# Patient Record
Sex: Female | Born: 1975 | Race: Black or African American | Hispanic: No | Marital: Married | Smoking: Never smoker
Health system: Southern US, Community
[De-identification: ages and names within clinical notes are randomized; demographics above are authoritative.]

## PROBLEM LIST (undated history)

## (undated) DIAGNOSIS — F319 Bipolar disorder, unspecified: Secondary | ICD-10-CM

## (undated) DIAGNOSIS — F32A Depression, unspecified: Secondary | ICD-10-CM

## (undated) DIAGNOSIS — F329 Major depressive disorder, single episode, unspecified: Secondary | ICD-10-CM

## (undated) HISTORY — PX: TUBAL LIGATION: SHX77

---

## 1998-04-21 ENCOUNTER — Ambulatory Visit (HOSPITAL_BASED_OUTPATIENT_CLINIC_OR_DEPARTMENT_OTHER): Admission: RE | Admit: 1998-04-21 | Discharge: 1998-04-21 | Payer: Self-pay | Admitting: Surgery

## 1998-07-30 ENCOUNTER — Inpatient Hospital Stay (HOSPITAL_COMMUNITY): Admission: AD | Admit: 1998-07-30 | Discharge: 1998-07-30 | Payer: Self-pay | Admitting: Obstetrics & Gynecology

## 1999-01-14 ENCOUNTER — Inpatient Hospital Stay (HOSPITAL_COMMUNITY): Admission: AD | Admit: 1999-01-14 | Discharge: 1999-01-14 | Payer: Self-pay | Admitting: *Deleted

## 1999-02-26 ENCOUNTER — Encounter: Payer: Self-pay | Admitting: Obstetrics & Gynecology

## 1999-02-26 ENCOUNTER — Ambulatory Visit (HOSPITAL_COMMUNITY): Admission: RE | Admit: 1999-02-26 | Discharge: 1999-02-26 | Payer: Self-pay | Admitting: Obstetrics and Gynecology

## 1999-03-18 ENCOUNTER — Inpatient Hospital Stay (HOSPITAL_COMMUNITY): Admission: AD | Admit: 1999-03-18 | Discharge: 1999-03-18 | Payer: Self-pay | Admitting: Obstetrics and Gynecology

## 1999-03-21 ENCOUNTER — Inpatient Hospital Stay (HOSPITAL_COMMUNITY): Admission: AD | Admit: 1999-03-21 | Discharge: 1999-03-21 | Payer: Self-pay | Admitting: Obstetrics & Gynecology

## 1999-03-23 ENCOUNTER — Inpatient Hospital Stay (HOSPITAL_COMMUNITY): Admission: AD | Admit: 1999-03-23 | Discharge: 1999-03-27 | Payer: Self-pay | Admitting: Obstetrics and Gynecology

## 1999-09-17 ENCOUNTER — Other Ambulatory Visit: Admission: RE | Admit: 1999-09-17 | Discharge: 1999-09-17 | Payer: Self-pay | Admitting: Obstetrics and Gynecology

## 1999-11-30 ENCOUNTER — Other Ambulatory Visit: Admission: RE | Admit: 1999-11-30 | Discharge: 1999-11-30 | Payer: Self-pay | Admitting: Obstetrics and Gynecology

## 2000-08-02 ENCOUNTER — Other Ambulatory Visit: Admission: RE | Admit: 2000-08-02 | Discharge: 2000-08-02 | Payer: Self-pay | Admitting: Obstetrics and Gynecology

## 2001-11-14 ENCOUNTER — Other Ambulatory Visit: Admission: RE | Admit: 2001-11-14 | Discharge: 2001-11-14 | Payer: Self-pay | Admitting: Obstetrics and Gynecology

## 2002-09-06 ENCOUNTER — Encounter: Payer: Self-pay | Admitting: *Deleted

## 2002-09-06 ENCOUNTER — Ambulatory Visit (HOSPITAL_COMMUNITY): Admission: RE | Admit: 2002-09-06 | Discharge: 2002-09-06 | Payer: Self-pay | Admitting: *Deleted

## 2003-01-23 ENCOUNTER — Other Ambulatory Visit: Admission: RE | Admit: 2003-01-23 | Discharge: 2003-01-23 | Payer: Self-pay | Admitting: Obstetrics and Gynecology

## 2003-02-01 ENCOUNTER — Ambulatory Visit (HOSPITAL_COMMUNITY): Admission: RE | Admit: 2003-02-01 | Discharge: 2003-02-01 | Payer: Self-pay | Admitting: Obstetrics and Gynecology

## 2003-02-01 ENCOUNTER — Encounter: Payer: Self-pay | Admitting: Obstetrics and Gynecology

## 2003-02-07 ENCOUNTER — Inpatient Hospital Stay (HOSPITAL_COMMUNITY): Admission: AD | Admit: 2003-02-07 | Discharge: 2003-02-07 | Payer: Self-pay | Admitting: Obstetrics and Gynecology

## 2003-06-13 ENCOUNTER — Inpatient Hospital Stay (HOSPITAL_COMMUNITY): Admission: AD | Admit: 2003-06-13 | Discharge: 2003-06-13 | Payer: Self-pay | Admitting: Obstetrics and Gynecology

## 2003-06-14 ENCOUNTER — Encounter: Payer: Self-pay | Admitting: Obstetrics and Gynecology

## 2003-06-14 ENCOUNTER — Ambulatory Visit (HOSPITAL_COMMUNITY): Admission: RE | Admit: 2003-06-14 | Discharge: 2003-06-14 | Payer: Self-pay | Admitting: Obstetrics and Gynecology

## 2003-06-27 ENCOUNTER — Ambulatory Visit (HOSPITAL_COMMUNITY): Admission: RE | Admit: 2003-06-27 | Discharge: 2003-06-27 | Payer: Self-pay | Admitting: Obstetrics and Gynecology

## 2003-08-23 ENCOUNTER — Inpatient Hospital Stay (HOSPITAL_COMMUNITY): Admission: AD | Admit: 2003-08-23 | Discharge: 2003-08-23 | Payer: Self-pay | Admitting: Obstetrics and Gynecology

## 2003-08-29 ENCOUNTER — Inpatient Hospital Stay (HOSPITAL_COMMUNITY): Admission: RE | Admit: 2003-08-29 | Discharge: 2003-08-29 | Payer: Self-pay | Admitting: Obstetrics and Gynecology

## 2003-08-29 ENCOUNTER — Encounter: Payer: Self-pay | Admitting: Obstetrics and Gynecology

## 2003-08-30 ENCOUNTER — Inpatient Hospital Stay (HOSPITAL_COMMUNITY): Admission: RE | Admit: 2003-08-30 | Discharge: 2003-09-02 | Payer: Self-pay | Admitting: Obstetrics and Gynecology

## 2003-09-03 ENCOUNTER — Encounter: Admission: RE | Admit: 2003-09-03 | Discharge: 2003-10-03 | Payer: Self-pay | Admitting: Obstetrics and Gynecology

## 2004-09-21 ENCOUNTER — Other Ambulatory Visit: Admission: RE | Admit: 2004-09-21 | Discharge: 2004-09-21 | Payer: Self-pay | Admitting: Obstetrics and Gynecology

## 2005-06-25 ENCOUNTER — Encounter: Admission: RE | Admit: 2005-06-25 | Discharge: 2005-06-25 | Payer: Self-pay | Admitting: Obstetrics and Gynecology

## 2005-07-24 ENCOUNTER — Emergency Department (HOSPITAL_COMMUNITY): Admission: EM | Admit: 2005-07-24 | Discharge: 2005-07-24 | Payer: Self-pay | Admitting: Emergency Medicine

## 2006-05-17 ENCOUNTER — Other Ambulatory Visit: Admission: RE | Admit: 2006-05-17 | Discharge: 2006-05-17 | Payer: Self-pay | Admitting: Obstetrics and Gynecology

## 2006-09-28 ENCOUNTER — Ambulatory Visit (HOSPITAL_COMMUNITY): Admission: RE | Admit: 2006-09-28 | Discharge: 2006-09-28 | Payer: Self-pay | Admitting: Family Medicine

## 2007-02-14 ENCOUNTER — Emergency Department (HOSPITAL_COMMUNITY): Admission: EM | Admit: 2007-02-14 | Discharge: 2007-02-14 | Payer: Self-pay | Admitting: Emergency Medicine

## 2007-02-20 ENCOUNTER — Encounter: Admission: RE | Admit: 2007-02-20 | Discharge: 2007-03-22 | Payer: Self-pay | Admitting: Family Medicine

## 2012-06-04 ENCOUNTER — Emergency Department (HOSPITAL_BASED_OUTPATIENT_CLINIC_OR_DEPARTMENT_OTHER): Payer: Managed Care, Other (non HMO)

## 2012-06-04 ENCOUNTER — Emergency Department (HOSPITAL_BASED_OUTPATIENT_CLINIC_OR_DEPARTMENT_OTHER)
Admission: EM | Admit: 2012-06-04 | Discharge: 2012-06-04 | Disposition: A | Discharge status: Home Health | Payer: Managed Care, Other (non HMO) | Attending: Emergency Medicine | Admitting: Emergency Medicine

## 2012-06-04 ENCOUNTER — Encounter (HOSPITAL_BASED_OUTPATIENT_CLINIC_OR_DEPARTMENT_OTHER): Payer: Self-pay | Admitting: *Deleted

## 2012-06-04 ENCOUNTER — Other Ambulatory Visit: Payer: Self-pay

## 2012-06-04 DIAGNOSIS — R091 Pleurisy: Secondary | ICD-10-CM | POA: Insufficient documentation

## 2012-06-04 DIAGNOSIS — F319 Bipolar disorder, unspecified: Secondary | ICD-10-CM | POA: Insufficient documentation

## 2012-06-04 DIAGNOSIS — E876 Hypokalemia: Secondary | ICD-10-CM | POA: Insufficient documentation

## 2012-06-04 HISTORY — DX: Depression, unspecified: F32.A

## 2012-06-04 HISTORY — DX: Bipolar disorder, unspecified: F31.9

## 2012-06-04 HISTORY — DX: Major depressive disorder, single episode, unspecified: F32.9

## 2012-06-04 LAB — URINALYSIS, ROUTINE W REFLEX MICROSCOPIC
Bilirubin Urine: NEGATIVE
Glucose, UA: NEGATIVE mg/dL
Ketones, ur: NEGATIVE mg/dL
Nitrite: NEGATIVE
Protein, ur: NEGATIVE mg/dL
pH: 5.5 (ref 5.0–8.0)

## 2012-06-04 LAB — CBC
Hemoglobin: 12.7 g/dL (ref 12.0–15.0)
Platelets: 234 10*3/uL (ref 150–400)
RBC: 4.22 MIL/uL (ref 3.87–5.11)
WBC: 8.3 10*3/uL (ref 4.0–10.5)

## 2012-06-04 LAB — DIFFERENTIAL
Eosinophils Absolute: 0.1 10*3/uL (ref 0.0–0.7)
Lymphocytes Relative: 32 % (ref 12–46)
Lymphs Abs: 2.6 10*3/uL (ref 0.7–4.0)
Monocytes Relative: 7 % (ref 3–12)
Neutro Abs: 5 10*3/uL (ref 1.7–7.7)
Neutrophils Relative %: 61 % (ref 43–77)

## 2012-06-04 LAB — COMPREHENSIVE METABOLIC PANEL
ALT: 20 U/L (ref 0–35)
Alkaline Phosphatase: 69 U/L (ref 39–117)
BUN: 10 mg/dL (ref 6–23)
Chloride: 101 mEq/L (ref 96–112)
GFR calc Af Amer: >90 mL/min (ref >90)
Glucose, Bld: 108 mg/dL — ABNORMAL HIGH (ref 70–99)
Potassium: 3.2 mEq/L — ABNORMAL LOW (ref 3.5–5.1)
Sodium: 138 mEq/L (ref 135–145)
Total Bilirubin: 0.3 mg/dL (ref 0.3–1.2)
Total Protein: 7.5 g/dL (ref 6.0–8.3)

## 2012-06-04 MED ORDER — IBUPROFEN 600 MG PO TABS: 600.0000 mg | ORAL_TABLET | Freq: Four times a day (QID) | ORAL | Status: AC | PRN

## 2012-06-04 MED ORDER — POTASSIUM CHLORIDE CRYS ER 20 MEQ PO TBCR: 20.0000 meq | EXTENDED_RELEASE_TABLET | Freq: Two times a day (BID) | ORAL | Status: AC

## 2012-06-04 NOTE — Discharge Instructions (Signed)
Foods Rich in Potassium Food / Potassium (mg)  Apricots, dried,  cup / 378 mg   Apricots, raw, 1 cup halves / 401 mg   Avocado,  / 487 mg   Banana, 1 large / 487 mg   Beef, lean, round, 3 oz / 202 mg   Cantaloupe, 1 cup cubes / 427 mg   Dates, medjool, 5 whole / 835 mg   Ham, cured, 3 oz / 212 mg   Lentils, dried,  cup / 458 mg   Lima beans, frozen,  cup / 258 mg   Orange, 1 large / 333 mg   Orange juice, 1 cup / 443 mg   Peaches, dried,  cup / 398 mg   Peas, split, cooked,  cup / 355 mg   Potato, boiled, 1 medium / 515 mg   Prunes, dried, uncooked,  cup / 318 mg   Raisins,  cup / 309 mg   Salmon, pink, raw, 3 oz / 275 mg   Sardines, canned , 3 oz / 338 mg   Tomato, raw, 1 medium / 292 mg   Tomato juice, 6 oz / 417 mg   [place], 3 oz / 349 mg  Document Released: 12/06/2005 Document Revised: 08/18/2011 Document Reviewed: 04/21/2009 [facility] Patient Information 2012 [place], [place].Hypokalemia Hypokalemia means a low potassium level in the blood.Potassium is an electrolyte that helps regulate the amount of fluid in the body. It also stimulates muscle contraction and maintains a stable acid-base balance.Most of the body's potassium is inside of cells, and only a very small amount is in the blood. Because the amount in the blood is so small, minor changes can have big effects. PREPARATION FOR TEST Testing for potassium requires taking a blood sample taken by needle from a vein in the arm. The skin is cleaned thoroughly before the sample is drawn. There is no other special preparation needed. NORMAL VALUES Potassium levels below 3.5 mEq/L are abnormally low. Levels above 5.1 mEq/L are abnormally high. Ranges for normal findings may vary among different laboratories and hospitals. You should always check with your doctor after having lab work or other tests done to discuss the meaning of your test results and whether your values are considered within normal  limits. MEANING OF TEST  Your caregiver will go over the test results with you and discuss the importance and meaning of your results, as well as treatment options and the need for additional tests, if necessary. A potassium level is frequently part of a routine medical exam. It is usually included as part of a whole "panel" of tests for several blood salts (such as Sodium and Chloride). It may be done as part of follow-up when a low potassium level was found in the past or other blood salts are suspected of being out of balance. A low potassium level might be suspected if you have one or more of the following:  Symptoms of weakness.   Abnormal heart rhythms.   High blood pressure and are taking medication to control this, especially water pills (diuretics).   Kidney disease that can affect your potassium level .   Diabetes requiring the use of insulin. The potassium may fall after taking insulin, especially if the diabetes had been out of control for a while.   A condition requiring the use of cortisone-type medication or certain types of antibiotics.   Vomiting and/or diarrhea for more than a day or two.   A stomach or intestinal condition that may not permit  appropriate absorption of potassium.   Fainting episodes.   Mental confusion.  OBTAINING TEST RESULTS It is your responsibility to obtain your test results. Ask the lab or department performing the test when and how you will get your results.  Please contact your caregiver directly if you have not received the results within one week. At that time, ask if there is anything different or new you should be doing in relation to the results. TREATMENT Hypokalemia can be treated with potassium supplements taken by mouth and/or adjustments in your current medications. A diet high in potassium is also helpful. Foods with high potassium content are:  Peas, lentils, lima beans, nuts, and dried fruit.   Whole grain and bran cereals  and breads.   Fresh fruit, vegetables (bananas, cantaloupe, grapefruit, oranges, tomatoes, honeydew melons, potatoes).   Orange and tomato juices.   Meats. If potassium supplement has been prescribed for you today or your medications have been adjusted, see your personal caregiver in time02 for a re-check.  SEEK MEDICAL CARE IF:  There is a feeling of worsening weakness.   You experience repeated chest palpitations.   You are diabetic and having difficulty keeping your blood sugars in the normal range.   You are experiencing vomiting and/or diarrhea.   You are having difficulty with any of your regular medications.  SEEK IMMEDIATE MEDICAL CARE IF:  You experience chest pain, shortness of breath, or episodes of dizziness.   You have been having vomiting or diarrhea for more than 2 days.   You have a fainting episode.  MAKE SURE YOU:   Understand these instructions.   Will watch your condition.   Will get help right away if you are not doing well or get worse.  Document Released: 12/06/2005 Document Revised: 11/25/2011 Document Reviewed: 11/16/2008 [facility] Patient Information 2012 [place], [place].    Pleurisy Pleurisy is an inflammation and swelling of the lining of the lungs. It usually is the result of an underlying infection or other disease. Because of this inflammation, it hurts to breathe. It is aggravated by coughing or deep breathing. The primary goal in treating pleurisy is to diagnose and treat the condition that caused it.  HOME CARE INSTRUCTIONS   Only take over-the-counter or prescription medicines for pain, discomfort, or fever as directed by your caregiver.   If medications which kill germs (antibiotics) were prescribed, take the entire course. Even if you are feeling better, you need to take them.   Use a cool mist vaporizer to help loosen secretions. This is so the secretions can be coughed up more easily.  SEEK MEDICAL CARE IF:   Your pain is not  controlled with medication or is increasing.   You have an increase inpus like (purulent) secretions brought up with coughing.  SEEK IMMEDIATE MEDICAL CARE IF:   You have blue or dark lips, fingernails, or toenails.   You begin coughing up blood.   You have increased difficulty breathing.   You have continuing pain unrelieved by medicine or lasting more than 1 week.   You have pain that radiates into your neck, arms, or jaw.   You develop increased shortness of breath or wheezing.   You develop a fever, rash, vomiting, fainting, or other serious complaints.  Document Released: 12/06/2005 Document Revised: 11/25/2011 Document Reviewed: 07/07/2007 [facility] Patient Information 2012 [place], [place].

## 2012-06-04 NOTE — ED Provider Notes (Signed)
History   This chart was scribed for Nelia Shi, MD by Toya Smothers. The patient was seen in room MH05/MH05. Patient's care was started at 1939.  CSN: [id number]  Arrival date & time 06/04/12  1939   First MD Initiated Contact with Patient 06/04/12 2001      Chief Complaint  Patient presents with  . Chest Pain   Patient is a 36 y.o. female presenting with chest pain. The history is provided by the patient. No language interpreter was used.  Chest Pain Pertinent negatives for primary symptoms include no fever, no shortness of breath, no cough, no abdominal pain, no nausea and no vomiting.  Pertinent negatives for associated symptoms include no weakness.    Kristina Griffin is a 36 y.o. female who presents to the Emergency Department complaining of sudden onset moderate severe constant localized Right side chest pain onset 4 days ago agrevated with inspiration with associate arm/leg swelling, increased burping, and weight gain, denying coughing of blood, cold, sore throat, and h/o collapsed lung. Pt reports that she has recently changed medication dosage from 200 ml of Lamictal changed to 100 ml and that she had tubal ligations 3 months ago, denying change in activity or diet. 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   Past Medical History  Diagnosis Date  . Depression   . Bipolar disorder     Past Surgical History  Procedure Date  . Cesarean section   . Tubal ligation     History reviewed. No pertinent family history.  History  Substance Use Topics  . Smoking status: Never Smoker   . Smokeless tobacco: Not on file  . Alcohol Use: No   Review of Systems  Constitutional: Positive for unexpected weight change. Negative for fever and chills.  HENT: Negative for sore throat and rhinorrhea.   Eyes: Negative for pain.  Respiratory: Negative for cough and  shortness of breath.   Cardiovascular: Positive for chest pain and leg swelling.  Gastrointestinal: Negative for nausea, vomiting, abdominal pain and diarrhea.       Burping.  Genitourinary: Negative for dysuria.  Musculoskeletal: Negative for back pain.  Skin: Negative for rash.  Neurological: Negative for weakness and headaches.    Allergies  Review of patient's allergies indicates no known allergies.  Home Medications   Current Outpatient Rx  Name Route Sig Dispense Refill  . BUPROPION HCL 100 MG PO TABS Oral Take 100 mg by mouth 2 (two) times daily.    Marland Kitchen LAMOTRIGINE 100 MG PO TABS Oral Take 100 mg by mouth daily.    . IBUPROFEN 600 MG PO TABS Oral Take 1 tablet (600 mg total) by mouth every 6 (six) hours as needed for pain. 30 tablet 0  . POTASSIUM CHLORIDE CRYS ER 20 MEQ PO TBCR Oral Take 1 tablet (20 mEq total) by mouth 2 (two) times daily. 30 tablet 0    BP 113/71  Pulse 72  Temp 98.5 F (36.9 C) (Oral)  Resp 16  Ht 5\' 4"  (1.626 m)  Wt 207 lb (93.895 kg)  BMI 35.53 kg/m2  SpO2 100%  LMP 05/21/2012  Physical Exam  Nursing note and vitals reviewed. Constitutional: She is oriented to person, place, and time. She appears well-developed and well-nourished. No distress.  HENT:  Head: Normocephalic and atraumatic.  Eyes: Pupils are equal, round, and reactive to light.  Neck: Normal  range of motion.  Cardiovascular: Normal rate and intact distal pulses.         Date: 06/04/2012  Rate: 91  Rhythm: normal sinus rhythm  QRS Axis: normal  Intervals: normal  ST/T Wave abnormalities: normal  Conduction Disutrbances: none  Narrative Interpretation: unremarkable      Pulmonary/Chest: No respiratory distress.  Abdominal: Normal appearance. She exhibits no distension.  Musculoskeletal: Normal range of motion.  Neurological: She is alert and oriented to person, place, and time. No cranial nerve deficit.  Skin: Skin is warm and dry. No rash noted.  Psychiatric: She has a  normal mood and affect. Her behavior is normal.    ED Course  Procedures (including critical care time) DIAGNOSTIC STUDIES: Oxygen Saturation is 100% on room air, normal by my interpretation.    COORDINATION OF CARE: 20:07- Ordered Test, blood work. Reviwed EKG.  Labs Reviewed  COMPREHENSIVE METABOLIC PANEL - Abnormal; Notable for the following:    Potassium 3.2 (*)     Glucose, Bld 108 (*)     All other components within normal limits  CBC  DIFFERENTIAL  URINALYSIS, ROUTINE W REFLEX MICROSCOPIC  D-DIMER, QUANTITATIVE  TROPONIN I   Dg Chest 2 View  06/04/2012  *RADIOLOGY REPORT*  Clinical Data: Right-sided pleuritic chest pain.  CHEST - 2 VIEW  Comparison:  None.  Findings:  The heart size and mediastinal contours are within normal limits.  Both lungs are clear.  No evidence of pneumothorax or pleural effusion.  The visualized skeletal structures are unremarkable.  IMPRESSION: No active cardiopulmonary disease.  Original Report Authenticated By: Danae Orleans, M.D.     1. Pleurisy   2. Hypokalemia       MDM   I personally performed the services described in this documentation, which was scribed in my presence. The recorded information has been reviewed and considered.     Nelia Shi, MD 06/04/12 2103

## 2012-06-04 NOTE — ED Notes (Signed)
Pt c/o R sided CP onset 4 days ago.  Pt states pain increases with inspiration.  Pt denies N/V/SOB/diaphoresis.  Pt states she feels as though her extremities are swollen.  Pt states she has been using antigas with no relief.

## 2012-06-04 NOTE — ED Provider Notes (Deleted)
History     CSN: [id number]  Arrival date & time 06/04/12  1939   First MD Initiated Contact with Patient 06/04/12 2001      Chief Complaint  Patient presents with  . Chest Pain     HPI Pt c/o R sided CP onset 4 days ago. Pt states pain increases with inspiration. Pt denies N/V/SOB/diaphoresis. Pt states she feels as though her extremities are swollen. Pt states she has been using antigas with no relief.   Past Medical History  Diagnosis Date  . Depression   . Bipolar disorder     Past Surgical History  Procedure Date  . Cesarean section   . Tubal ligation     History reviewed. No pertinent family history.  History  Substance Use Topics  . Smoking status: Never Smoker   . Smokeless tobacco: Not on file  . Alcohol Use: No    OB History    Grav Para Term Preterm Abortions TAB SAB Ect Mult Living                  Review of Systems  All other systems reviewed and are negative.    Allergies  Review of patient's allergies indicates no known allergies.  Home Medications   Current Outpatient Rx  Name Route Sig Dispense Refill  . BUPROPION HCL 100 MG PO TABS Oral Take 100 mg by mouth 2 (two) times daily.    Marland Kitchen LAMOTRIGINE 100 MG PO TABS Oral Take 100 mg by mouth daily.    . IBUPROFEN 600 MG PO TABS Oral Take 1 tablet (600 mg total) by mouth every 6 (six) hours as needed for pain. 30 tablet 0  . POTASSIUM CHLORIDE CRYS ER 20 MEQ PO TBCR Oral Take 1 tablet (20 mEq total) by mouth 2 (two) times daily. 30 tablet 0    BP 112/71  Pulse 85  Temp 98.5 F (36.9 C) (Oral)  Resp 16  Ht 5\' 4"  (1.626 m)  Wt 207 lb (93.895 kg)  BMI 35.53 kg/m2  SpO2 100%  LMP 05/21/2012  Physical Exam  Nursing note and vitals reviewed. Constitutional: She is oriented to person, place, and time. She appears well-developed and well-nourished. No distress.  HENT:  Head: Normocephalic and atraumatic.  Eyes: Pupils are equal, round, and reactive to light.  Neck: Normal range of  motion.  Cardiovascular: Normal rate and intact distal pulses.        EKG showed no acute changes.  Patient was in sinus rhythm  Pulmonary/Chest: No respiratory distress.  Abdominal: Normal appearance. She exhibits no distension.  Musculoskeletal: Normal range of motion.  Neurological: She is alert and oriented to person, place, and time. No cranial nerve deficit.  Skin: Skin is warm and dry. No rash noted.  Psychiatric: She has a normal mood and affect. Her behavior is normal.    ED Course  Procedures (including critical care time)  Labs Reviewed  COMPREHENSIVE METABOLIC PANEL - Abnormal; Notable for the following:    Potassium 3.2 (*)     Glucose, Bld 108 (*)     All other components within normal limits  CBC  DIFFERENTIAL  URINALYSIS, ROUTINE W REFLEX MICROSCOPIC  TROPONIN I  D-DIMER, QUANTITATIVE   Dg Chest 2 View  06/04/2012  *RADIOLOGY REPORT*  Clinical Data: Right-sided pleuritic chest pain.  CHEST - 2 VIEW  Comparison:  None.  Findings:  The heart size and mediastinal contours are within normal limits.  Both lungs are clear.  No evidence  of pneumothorax or pleural effusion.  The visualized skeletal structures are unremarkable.  IMPRESSION: No active cardiopulmonary disease.  Original Report Authenticated By: Danae Orleans, M.D.     1. Pleurisy   2. Hypokalemia       MDM         Nelia Shi, MD 06/06/12 [phone]

## 2016-06-10 ENCOUNTER — Ambulatory Visit (INDEPENDENT_AMBULATORY_CARE_PROVIDER_SITE_OTHER): Payer: BLUE CROSS/BLUE SHIELD | Admitting: Obstetrics & Gynecology

## 2016-06-10 ENCOUNTER — Encounter: Payer: Self-pay | Admitting: Obstetrics & Gynecology

## 2016-06-10 VITALS — BP 124/78 | HR 67 | Ht 64.0 in | Wt 222.0 lb

## 2016-06-10 DIAGNOSIS — Z113 Encounter for screening for infections with a predominantly sexual mode of transmission: Secondary | ICD-10-CM

## 2016-06-10 DIAGNOSIS — R5383 Other fatigue: Secondary | ICD-10-CM

## 2016-06-10 DIAGNOSIS — R635 Abnormal weight gain: Secondary | ICD-10-CM

## 2016-06-10 DIAGNOSIS — N898 Other specified noninflammatory disorders of vagina: Secondary | ICD-10-CM | POA: Diagnosis not present

## 2016-06-10 LAB — CBC
HCT: 34.8 % — ABNORMAL LOW (ref 35.0–45.0)
Hemoglobin: 11.3 g/dL — ABNORMAL LOW (ref 11.7–15.5)
MCH: 29.3 pg (ref 27.0–33.0)
MCHC: 32.5 g/dL (ref 32.0–36.0)
MCV: 90.2 fL (ref 80.0–100.0)
MPV: 11.1 fL (ref 7.5–12.5)
PLATELETS: 254 10*3/uL (ref 140–400)
RBC: 3.86 MIL/uL (ref 3.80–5.10)
RDW: 15.1 % — AB (ref 11.0–15.0)
WBC: 6.1 10*3/uL (ref 3.8–10.8)

## 2016-06-10 LAB — HEMOGLOBIN A1C
HEMOGLOBIN A1C: 5.8 % — AB (ref <5.7)
MEAN PLASMA GLUCOSE: 120 mg/dL

## 2016-06-10 LAB — TSH: TSH: 0.37 mIU/L — ABNORMAL LOW

## 2016-06-10 NOTE — Progress Notes (Signed)
Patient has had vaginal discharge for a "few weeks" and she has tried over the counter medications with no relief. Patient complaining of odor and thick discharge. Kathrene Alu RN BSN

## 2016-06-10 NOTE — Patient Instructions (Signed)
GO WHITE: Soap: UNSCENTED Dove (white box light green writing) Laundry detergent (underwear)- Dreft or [employer] unscented WHITE 100% cotton panties (NOT just cotton crouch) Sanitary napkin/panty liners: UNSCENTED.  If it doesn't SAY unscented it can have a scent/perfume    NO PERFUMES OR LOTIONS OR POTIONS in the vulvar area (may use regular [place]) Condoms: hypoallergenic only. Non dyed (no color) Toilet papers: white only Wash clothes: use a separate wash cloth. WHITE.  Washed in Dreft.    Bacterial Vaginosis Bacterial vaginosis is a vaginal infection that occurs when the normal balance of bacteria in the vagina is disrupted. It results from an overgrowth of certain bacteria. This is the most common vaginal infection in women of childbearing age. Treatment is important to prevent complications, especially in pregnant women, as it can cause a premature delivery. CAUSES  Bacterial vaginosis is caused by an increase in harmful bacteria that are normally present in smaller amounts in the vagina. Several different kinds of bacteria can cause bacterial vaginosis. However, the reason that the condition develops is not fully understood. RISK FACTORS Certain activities or behaviors can put you at an increased risk of developing bacterial vaginosis, including:  Having a new sex partner or multiple sex partners.  Douching.  Using an intrauterine device (IUD) for contraception. Women do not get bacterial vaginosis from toilet seats, bedding, swimming pools, or contact with objects around them. SIGNS AND SYMPTOMS  Some women with bacterial vaginosis have no signs or symptoms. Common symptoms include:  Grey vaginal discharge.  A fishlike odor with discharge, especially after sexual intercourse.  Itching or burning of the vagina and vulva.  Burning or pain with urination. DIAGNOSIS  Your health care provider will take a medical history and examine the vagina for signs of bacterial vaginosis. A  sample of vaginal fluid may be taken. Your health care provider will look at this sample under a microscope to check for bacteria and abnormal cells. A vaginal pH test may also be done.  TREATMENT  Bacterial vaginosis may be treated with antibiotic medicines. These may be given in the form of a pill or a vaginal cream. A second round of antibiotics may be prescribed if the condition comes back after treatment. Because bacterial vaginosis increases your risk for sexually transmitted diseases, getting treated can help reduce your risk for chlamydia, gonorrhea, HIV, and herpes. HOME CARE INSTRUCTIONS   Only take over-the-counter or prescription medicines as directed by your health care provider.  If antibiotic medicine was prescribed, take it as directed. Make sure you finish it even if you start to feel better.  Tell all sexual partners that you have a vaginal infection. They should see their health care provider and be treated if they have problems, such as a mild rash or itching.  During treatment, it is important that you follow these instructions:  Avoid sexual activity or use condoms correctly.  Do not douche.  Avoid alcohol as directed by your health care provider.  Avoid breastfeeding as directed by your health care provider. SEEK MEDICAL CARE IF:   Your symptoms are not improving after 3 days of treatment.  You have increased discharge or pain.  You have a fever. MAKE SURE YOU:   Understand these instructions.  Will watch your condition.  Will get help right away if you are not doing well or get worse. FOR MORE INFORMATION  Centers for Disease Control and Prevention, Division of STD Prevention: AppraiserFraud.fi American Sexual Health Association (ASHA): www.ashastd.org  This information is not intended to replace advice given to you by your health care provider. Make sure you discuss any questions you have with your health care provider.   Document Released: 12/06/2005  Document Revised: 12/27/2014 Document Reviewed: 07/18/2013 Elsevier Interactive Patient Education [employer].

## 2016-06-10 NOTE — Progress Notes (Signed)
Patient ID: Kristina Griffin, female   DOB: 12-10-1976, 40 y.o.   MRN: [id number] History:  40 y.o. No obstetric history on file. here today for eval of excessive discharge. Pt has tried OTC garlic tabs and douching with no benefits.  The following portions of the patient's history were reviewed and updated as appropriate: allergies, current medications, past family history, past medical history, past social history, past surgical history and problem list.  Review of Systems:  Pt reports increased weight gain despite eaing right and exercising.  Pt reports fatigue despite reg exercise.  Objective:  Physical Exam Blood pressure 124/78, pulse 67, height 5\' 4"  (1.626 m), weight 222 lb (100.699 kg), last menstrual period 05/24/2016. Gen: NAD Abd: Soft, nontender and nondistended Pelvic: Normal appearing external genitalia; normal appearing vaginal mucosa and cervix.  Normal discharge.  Small uterus, no other palpable masses, no uterine or adnexal tenderness  Labs and Imaging No results found.  Assessment & Plan:  Diagnoses and all orders for this visit:  Vaginal discharge -     GC/Chlamydia probe amp ([place])not at [place]  Other fatigue -     CBC -     TSH -     HgB 123456 -     Follicle stimulating hormone  Excessive weight gain -     CBC -     TSH -     HgB 123456 -     Follicle stimulating hormone  Pt given GO WHITE hand out given  Pt will f/u for exam with PAP.  Pt wanted to get labs predrawn today Records from prev GYN Reviewed cardio vs weight lifting to add muscle mass and burn more calories Limiting sugar in all forms reviewed simple vs complex carbs and which to limit (processed foods)  Hoyle Sauer L. Harraway-Smith, M.D., [place]   26min in face to face conversation with pt  Hoyle Sauer L. Harraway-Smith, M.D., [place]

## 2016-06-11 ENCOUNTER — Telehealth: Payer: Self-pay

## 2016-06-11 LAB — WET PREP BY MOLECULAR PROBE
Candida species: POSITIVE — AB
GARDNERELLA VAGINALIS: POSITIVE — AB
TRICHOMONAS VAG: NEGATIVE

## 2016-06-11 LAB — FOLLICLE STIMULATING HORMONE: FSH: 3.7 m[IU]/mL

## 2016-06-11 MED ORDER — METRONIDAZOLE 500 MG PO TABS: 500.0000 mg | ORAL_TABLET | Freq: Two times a day (BID) | ORAL | Status: DC

## 2016-06-11 MED ORDER — FLUCONAZOLE 150 MG PO TABS: 150.0000 mg | ORAL_TABLET | Freq: Once | ORAL | Status: DC

## 2016-06-11 NOTE — Telephone Encounter (Signed)
Left message for patient to return call to office.  Patient has bacterial vaginosis and yeast infection. Rx for both sent to pharmacy. Kathrene Alu RN BSN

## 2016-06-15 LAB — GC/CHLAMYDIA PROBE AMP (~~LOC~~) NOT AT ARMC
CHLAMYDIA, DNA PROBE: NEGATIVE
Neisseria Gonorrhea: NEGATIVE

## 2016-06-19 ENCOUNTER — Encounter: Payer: Self-pay | Admitting: Obstetrics & Gynecology

## 2016-06-21 ENCOUNTER — Ambulatory Visit: Payer: BLUE CROSS/BLUE SHIELD | Admitting: Obstetrics & Gynecology

## 2016-06-24 ENCOUNTER — Encounter: Payer: Self-pay | Admitting: Obstetrics & Gynecology

## 2016-06-24 ENCOUNTER — Ambulatory Visit (INDEPENDENT_AMBULATORY_CARE_PROVIDER_SITE_OTHER): Payer: BLUE CROSS/BLUE SHIELD | Admitting: Obstetrics & Gynecology

## 2016-06-24 VITALS — BP 108/70 | HR 78 | Resp 16 | Ht 64.0 in | Wt 216.0 lb

## 2016-06-24 DIAGNOSIS — Z124 Encounter for screening for malignant neoplasm of cervix: Secondary | ICD-10-CM

## 2016-06-24 DIAGNOSIS — Z01419 Encounter for gynecological examination (general) (routine) without abnormal findings: Secondary | ICD-10-CM

## 2016-06-24 DIAGNOSIS — R946 Abnormal results of thyroid function studies: Secondary | ICD-10-CM | POA: Diagnosis not present

## 2016-06-24 DIAGNOSIS — Z1151 Encounter for screening for human papillomavirus (HPV): Secondary | ICD-10-CM | POA: Diagnosis not present

## 2016-06-24 DIAGNOSIS — Z6837 Body mass index (BMI) 37.0-37.9, adult: Secondary | ICD-10-CM | POA: Diagnosis not present

## 2016-06-24 DIAGNOSIS — N920 Excessive and frequent menstruation with regular cycle: Secondary | ICD-10-CM

## 2016-06-24 DIAGNOSIS — R7303 Prediabetes: Secondary | ICD-10-CM

## 2016-06-24 DIAGNOSIS — D508 Other iron deficiency anemias: Secondary | ICD-10-CM

## 2016-06-24 DIAGNOSIS — R7989 Other specified abnormal findings of blood chemistry: Secondary | ICD-10-CM

## 2016-06-24 MED ORDER — INTEGRA 62.5-62.5-40-3 MG PO CAPS: 1.0000 | ORAL_CAPSULE | Freq: Every day | ORAL | Status: AC

## 2016-06-24 NOTE — Patient Instructions (Signed)
Diabetes and Exercise Exercising regularly is important. It is not just about losing weight. It has many health benefits, such as:  Improving your overall fitness, flexibility, and endurance.  Increasing your bone density.  Helping with weight control.  Decreasing your body fat.  Increasing your muscle strength.  Reducing stress and tension.  Improving your overall health. People with diabetes who exercise gain additional benefits because exercise:  Reduces appetite.  Improves the body's use of blood sugar (glucose).  Helps lower or control blood glucose.  Decreases blood pressure.  Helps control blood lipids (such as cholesterol and triglycerides).  Improves the body's use of the hormone insulin by:  Increasing the body's insulin sensitivity.  Reducing the body's insulin needs.  Decreases the risk for heart disease because exercising:  Lowers cholesterol and triglycerides levels.  Increases the levels of good cholesterol (such as high-density lipoproteins [HDL]) in the body.  Lowers blood glucose levels. YOUR ACTIVITY PLAN  Choose an activity that you enjoy, and set realistic goals. To exercise safely, you should begin practicing any new physical activity slowly, and gradually increase the intensity of the exercise over time. Your health care provider or diabetes educator can help create an activity plan that works for you. General recommendations include:  Encouraging children to engage in at least 60 minutes of physical activity each day.  Stretching and performing strength training exercises, such as yoga or weight lifting, at least 2 times per week.  Performing a total of at least 150 minutes of moderate-intensity exercise each week, such as brisk walking or water aerobics.  Exercising at least 3 days per week, making sure you allow no more than 2 consecutive days to pass without exercising.  Avoiding long periods of inactivity (90 minutes or more). When you  have to spend an extended period of time sitting down, take frequent breaks to walk or stretch. RECOMMENDATIONS FOR EXERCISING WITH TYPE 1 OR TYPE 2 DIABETES   Check your blood glucose before exercising. If blood glucose levels are greater than 240 mg/dL, check for urine ketones. Do not exercise if ketones are present.  Avoid injecting insulin into areas of the body that are going to be exercised. For example, avoid injecting insulin into:  The arms when playing tennis.  The legs when jogging.  Keep a record of:  Food intake before and after you exercise.  Expected peak times of insulin action.  Blood glucose levels before and after you exercise.  The type and amount of exercise you have done.  Review your records with your health care provider. Your health care provider will help you to develop guidelines for adjusting food intake and insulin amounts before and after exercising.  If you take insulin or oral hypoglycemic agents, watch for signs and symptoms of hypoglycemia. They include:  Dizziness.  Shaking.  Sweating.  Chills.  Confusion.  Drink plenty of water while you exercise to prevent dehydration or heat stroke. Body water is lost during exercise and must be replaced.  Talk to your health care provider before starting an exercise program to make sure it is safe for you. Remember, almost any type of activity is better than none.   This information is not intended to replace advice given to you by your health care provider. Make sure you discuss any questions you have with your health care provider.   Document Released: 02/26/2004 Document Revised: 04/22/2015 Document Reviewed: 05/15/2013 Elsevier Interactive Patient Education 2016 Elsevier Inc.  

## 2016-06-24 NOTE — Progress Notes (Signed)
Patient ID: Kristina Griffin, female   DOB: 05/14/1976, 40 y.o.   MRN: [id number] Subjective:     Kristina Griffin is a 40 y.o. female here for a routine exam.  [id number]. LMP 06/19/2016.  Current complaints: Pt presents for an annual GYN exam with PAP. She reports heavy menses.  Sh ereprots that this is a change for her in the past year or so. No bleeding between cycles.  She finds this bothersome. Reports an increased weight gain with difficulty losing weight. Had labs drawn on last vist.    Prev dx'd with hypothyroidism.  Was on med.     Gynecologic History Patient's last menstrual period was 05/24/2016. Contraception: tubal ligation Last Pap: unk. Results were: pt reports an abnormal PAP in her 30's. Repeat was normal. Last mammogram: > 1 year prev. Results were: normal  Obstetric History OB History  No data available   The following portions of the patient's history were reviewed and updated as appropriate: allergies, current medications, past family history, past medical history, past social history, past surgical history and problem list.  Review of Systems Pertinent items are noted in HPI.    Objective:  BP 108/70 mmHg  Pulse 78  Resp 16  Ht 5\' 4"  (1.626 m)  Wt 216 lb (97.977 kg)  BMI 37.06 kg/m2  LMP 06/19/2016 General Appearance:    Alert, cooperative, no distress, appears stated age  Head:    Normocephalic, without obvious abnormality, atraumatic  Eyes:    conjunctiva/corneas clear, EOM's intact, both eyes  Ears:    Normal external ear canals, both ears  Nose:   Nares normal, septum midline, mucosa normal, no drainage    or sinus tenderness  Throat:   Lips, mucosa, and tongue normal; teeth and gums normal  Neck:   Supple, symmetrical, trachea midline, no adenopathy;    thyroid:  no enlargement/tenderness/nodules  Back:     Symmetric, no curvature, ROM normal, no CVA tenderness  Lungs:     Clear to auscultation bilaterally, respirations unlabored  Chest Wall:    No tenderness  or deformity   Heart:    Regular rate and rhythm, S1 and S2 normal, no murmur, rub   or gallop  Breast Exam:    No tenderness, masses, or nipple abnormality  Abdomen:     Soft, non-tender, bowel sounds active all four quadrants,    no masses, no organomegaly  Genitalia:    Normal female without lesion, discharge or tenderness   Vagina: no blood in vault; no abnormal discharge Cervix: no lesion; no mucopurulent d/c Uterus:enlarged; mobile- difficult to fully assess due to body habitus Adnexa: no masses; non tender   Extremities:   Extremities normal, atraumatic, no cyanosis or edema  Pulses:   2+ and symmetric all extremities  Skin:   Skin color, texture, turgor normal, no rashes or lesions   Recent Results (from the past 2160 hour(s))  GC/Chlamydia probe amp ([facility])not at [facility]     Status: None   Collection Time: 06/10/16 12:00 AM  Result Value Ref Range   Chlamydia Negative     Comment: Normal Reference Range - Negative   Neisseria gonorrhea Negative     Comment: Normal Reference Range - Negative  WET PREP BY MOLECULAR PROBE     Status: Abnormal   Collection Time: 06/10/16  9:54 AM  Result Value Ref Range   Candida species POS (A) Negative   Trichomonas vaginosis NEG Negative   Gardnerella vaginalis POS (A) Negative  CBC     Status: Abnormal   Collection Time: 06/10/16 10:32 AM  Result Value Ref Range   WBC 6.1 3.8 - 10.8 K/uL   RBC 3.86 3.80 - 5.10 MIL/uL   Hemoglobin 11.3 (L) 11.7 - 15.5 g/dL   HCT 34.8 (L) 35.0 - 45.0 %   MCV 90.2 80.0 - 100.0 fL   MCH 29.3 27.0 - 33.0 pg   MCHC 32.5 32.0 - 36.0 g/dL   RDW 15.1 (H) 11.0 - 15.0 %   Platelets 254 140 - 400 K/uL   MPV 11.1 7.5 - 12.5 fL    Comment: ** Please note change in unit of measure and reference range(s). **  TSH     Status: Abnormal   Collection Time: 06/10/16 10:32 AM  Result Value Ref Range   TSH 0.37 (L) mIU/L    Comment:   Reference Range   > or = 20 Years  0.40-4.50   Pregnancy Range First  trimester  0.26-2.66 Second trimester 0.55-2.73 Third trimester  0.43-2.91     HgB A1c     Status: Abnormal   Collection Time: 06/10/16 10:32 AM  Result Value Ref Range   Hgb A1c MFr Bld 5.8 (H) <5.7 %    Comment:   For someone without known diabetes, a hemoglobin A1c value between 5.7% and 6.4% is consistent with prediabetes and should be confirmed with a follow-up test.   For someone with known diabetes, a value <7% indicates that their diabetes is well controlled. A1c targets should be individualized based on duration of diabetes, age, co-morbid conditions and other considerations.   This assay result is consistent with an increased risk of diabetes.   Currently, no consensus exists regarding use of hemoglobin A1c for diagnosis of diabetes in children.      Mean Plasma Glucose 123456 mg/dL  Follicle stimulating hormone     Status: None   Collection Time: 06/10/16 10:32 AM  Result Value Ref Range   FSH 3.7 mIU/mL    Comment:   Reference Range Female >=10 years of age: Follicular Phase [id number] Mid-Cycle Peak   3.1-17.7 Luteal Phase     1.5-9.1 Postmenopausal   23.0-116.3      Assessment:    Healthy female exam.   Weight gain Heavy bleeding- suspect uterine fiborids Anemia- pt tried OTC iron but, could not tolerate it Obesity- see counseling rec below Thyroid- prev had abnormal results and required supplementation.  Need to obtain T3 and T4.TSH low Prediabetes- reviewed long term risk.  Reviewed diet and exercise     Plan:   Thyroid screening tests- free T3 and free T4 F/u PAP with hrHPV Screening mammogram F/u in 3 months or sooner prn rec Weight Watchers  Rec exercise program with GYN and weight training to increase muscle mass. Pelvic US- complete and TV   Brandi Tomlinson L. Harraway-Smith, M.D., [place]. Harraway-Smith, M.D., Cherlynn June

## 2016-06-25 LAB — CYTOLOGY - PAP

## 2016-06-25 LAB — T3, FREE: T3 FREE: 2.8 pg/mL (ref 2.3–4.2)

## 2016-06-25 LAB — T4, FREE: FREE T4: 1 ng/dL (ref 0.8–1.8)

## 2016-06-29 ENCOUNTER — Ambulatory Visit (HOSPITAL_BASED_OUTPATIENT_CLINIC_OR_DEPARTMENT_OTHER)
Admission: RE | Admit: 2016-06-29 | Discharge: 2016-06-29 | Disposition: A | Discharge status: Home / Self Care | Payer: BLUE CROSS/BLUE SHIELD | Source: Ambulatory Visit | Attending: Obstetrics & Gynecology | Admitting: Obstetrics & Gynecology

## 2016-06-29 DIAGNOSIS — Z01419 Encounter for gynecological examination (general) (routine) without abnormal findings: Secondary | ICD-10-CM | POA: Diagnosis not present

## 2016-06-29 DIAGNOSIS — N920 Excessive and frequent menstruation with regular cycle: Secondary | ICD-10-CM

## 2016-06-29 DIAGNOSIS — Z124 Encounter for screening for malignant neoplasm of cervix: Secondary | ICD-10-CM | POA: Diagnosis not present

## 2016-06-29 DIAGNOSIS — Z1231 Encounter for screening mammogram for malignant neoplasm of breast: Secondary | ICD-10-CM | POA: Insufficient documentation

## 2016-06-29 DIAGNOSIS — D250 Submucous leiomyoma of uterus: Secondary | ICD-10-CM | POA: Insufficient documentation

## 2016-06-30 ENCOUNTER — Encounter: Payer: Self-pay | Admitting: Obstetrics & Gynecology

## 2016-07-06 ENCOUNTER — Encounter: Payer: Self-pay | Admitting: Obstetrics & Gynecology

## 2016-07-09 ENCOUNTER — Other Ambulatory Visit: Payer: Self-pay | Admitting: Obstetrics & Gynecology

## 2016-07-09 DIAGNOSIS — B3731 Acute candidiasis of vulva and vagina: Secondary | ICD-10-CM

## 2016-07-09 DIAGNOSIS — B373 Candidiasis of vulva and vagina: Secondary | ICD-10-CM

## 2016-07-09 MED ORDER — FLUCONAZOLE 150 MG PO TABS: 150.0000 mg | ORAL_TABLET | Freq: Once | ORAL | Status: DC

## 2016-07-14 ENCOUNTER — Other Ambulatory Visit: Payer: Self-pay

## 2016-07-14 DIAGNOSIS — B3731 Acute candidiasis of vulva and vagina: Secondary | ICD-10-CM

## 2016-07-14 DIAGNOSIS — B373 Candidiasis of vulva and vagina: Secondary | ICD-10-CM

## 2016-07-14 MED ORDER — FLUCONAZOLE 150 MG PO TABS: 150.0000 mg | ORAL_TABLET | Freq: Once | ORAL | 0 refills | Status: AC

## 2017-06-30 ENCOUNTER — Encounter (INDEPENDENT_AMBULATORY_CARE_PROVIDER_SITE_OTHER): Payer: Self-pay | Admitting: Family Medicine

## 2017-08-18 ENCOUNTER — Telehealth: Payer: Self-pay | Admitting: General Practice

## 2017-08-18 NOTE — Telephone Encounter (Signed)
Received refill Rx for diflucan on patient from pharmacy. Called patient, no answer- left message stating we are trying to reach you regarding your refill request, sorry we have missed you. I will send you a mychart message so you can check there.

## 2017-08-19 ENCOUNTER — Other Ambulatory Visit: Payer: Self-pay | Admitting: General Practice

## 2017-08-19 DIAGNOSIS — B379 Candidiasis, unspecified: Secondary | ICD-10-CM

## 2017-08-19 MED ORDER — FLUCONAZOLE 150 MG PO TABS: 150.0000 mg | ORAL_TABLET | Freq: Once | ORAL | 0 refills | Status: AC

## 2018-05-09 IMAGING — US US TRANSVAGINAL NON-OB
1 series · 14 of 25 positions shown · non-contrast
Comparison: None

CLINICAL DATA: Heavy menses



[Series 1: us transvaginal non-ob · 0.21mm/px · 14 of 57 slices shown]
[im 1/57]
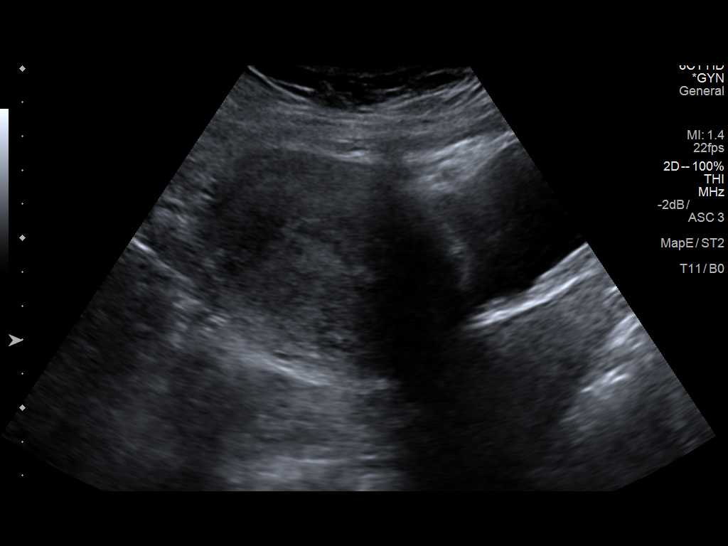
[im 5/57]
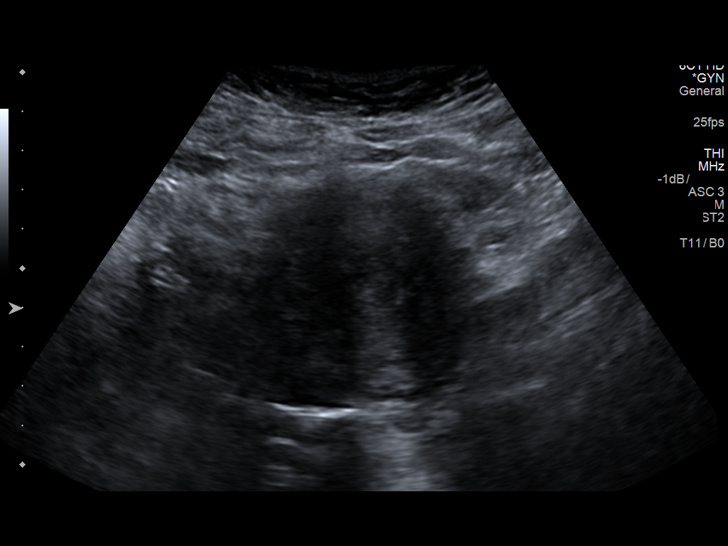
[im 10/57]
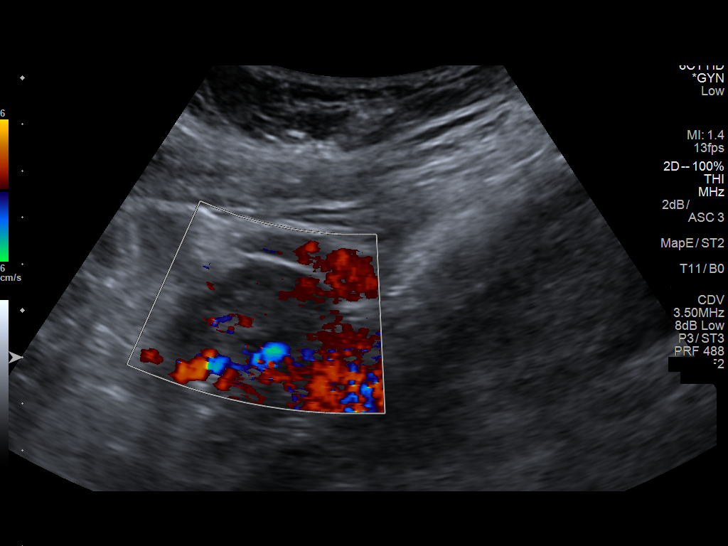
[im 15/57]
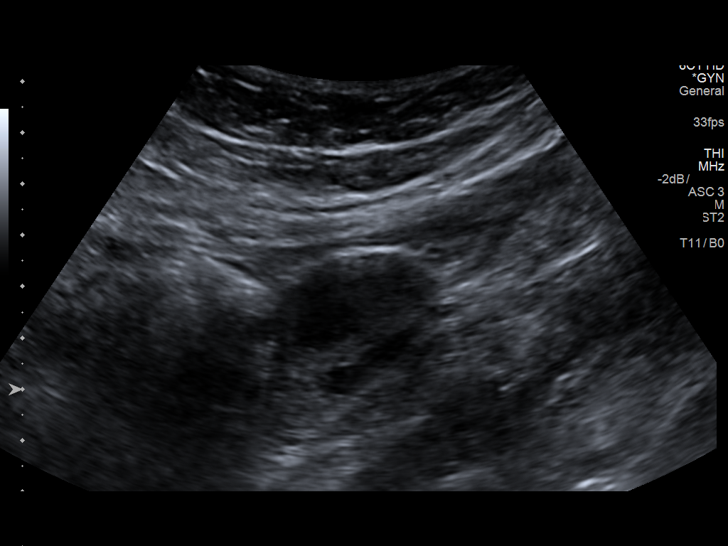
[im 19/57]
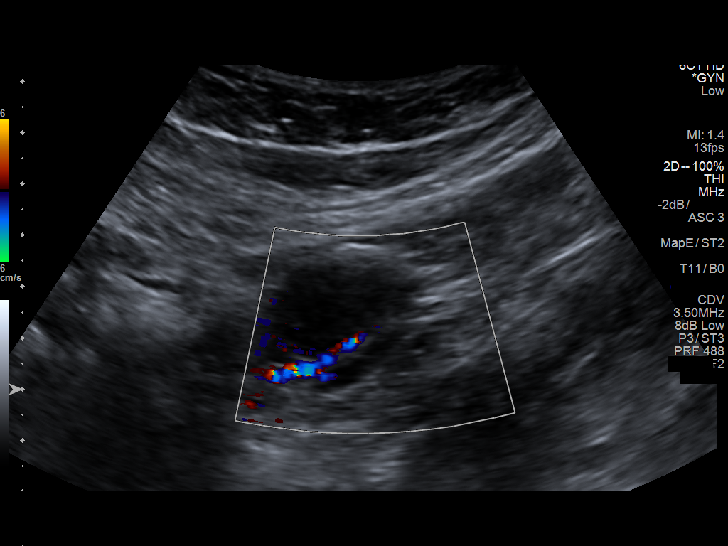
[im 22/57]
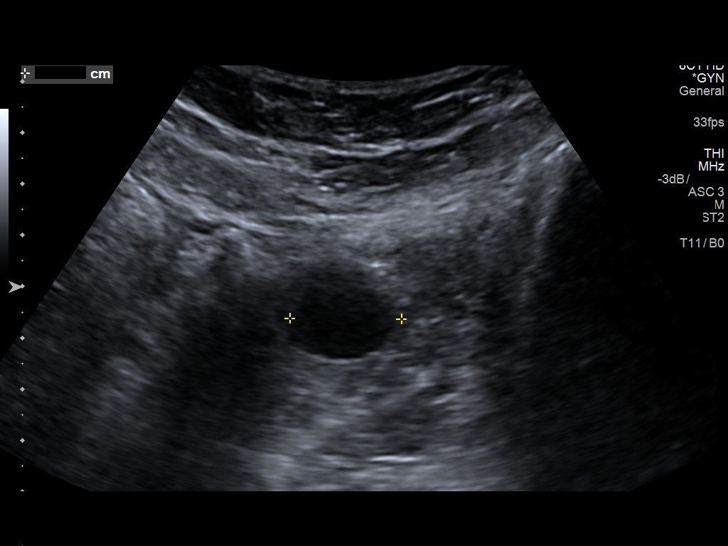
[im 26/57]
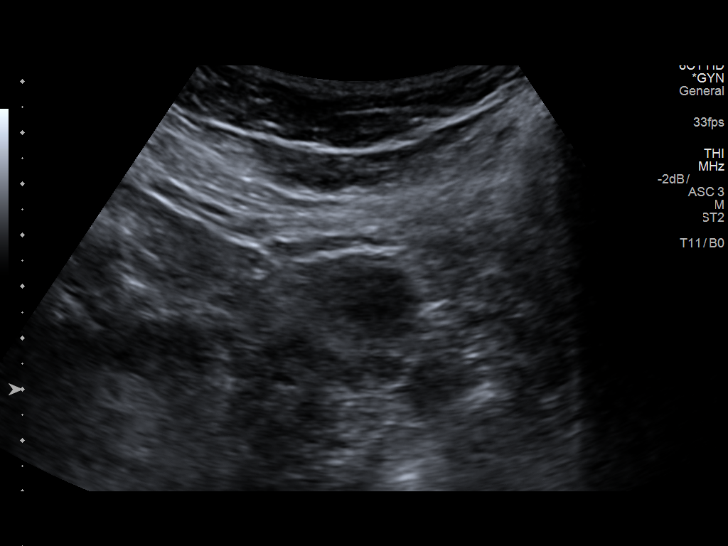
[im 31/57]
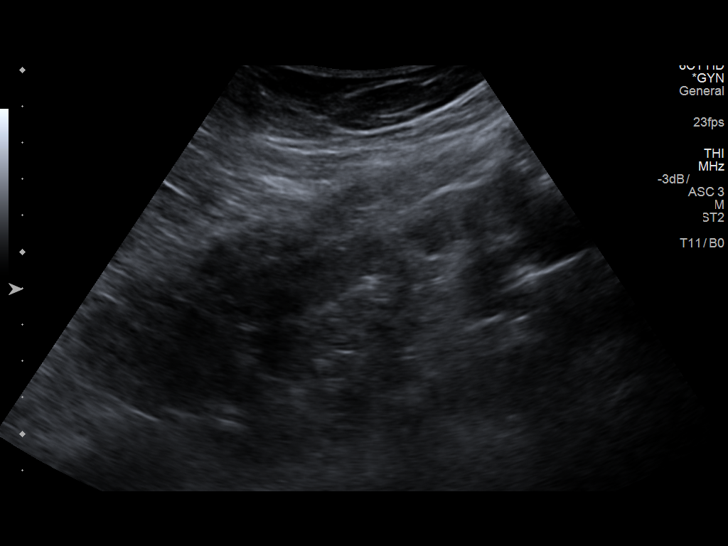
[im 36/57]
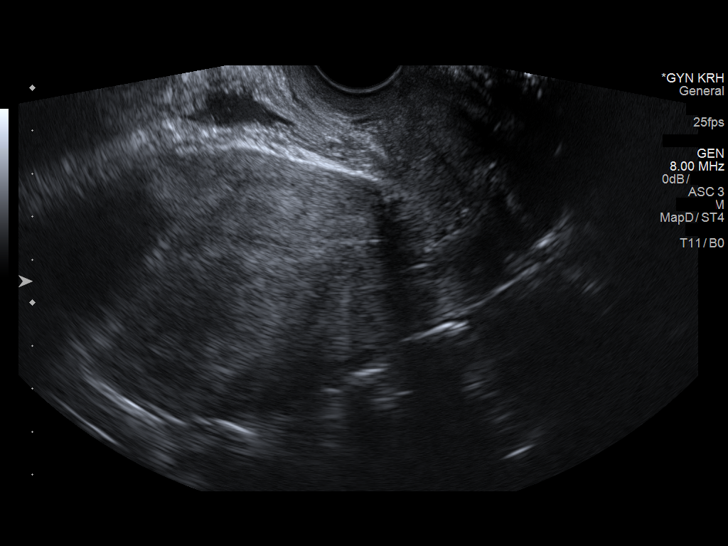
[im 38/57]
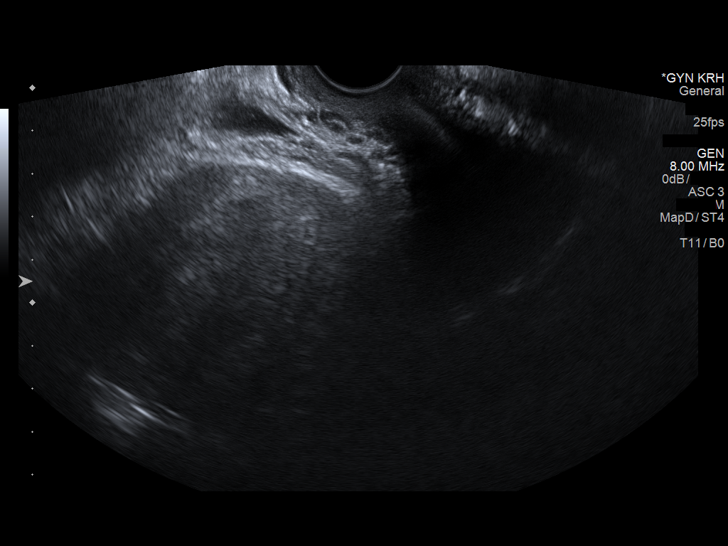
[im 43/57]
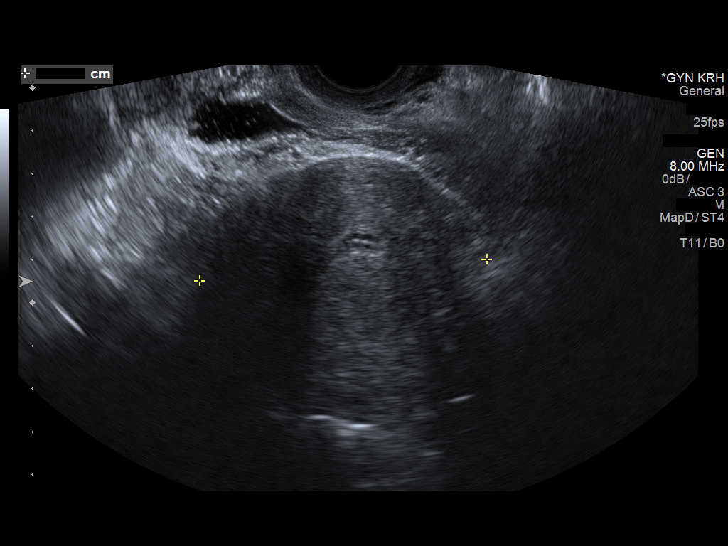
[im 47/57]
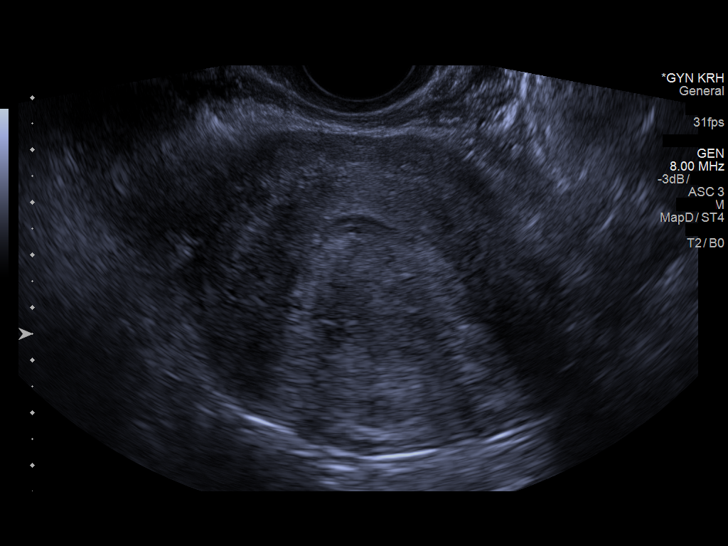
[im 52/57]
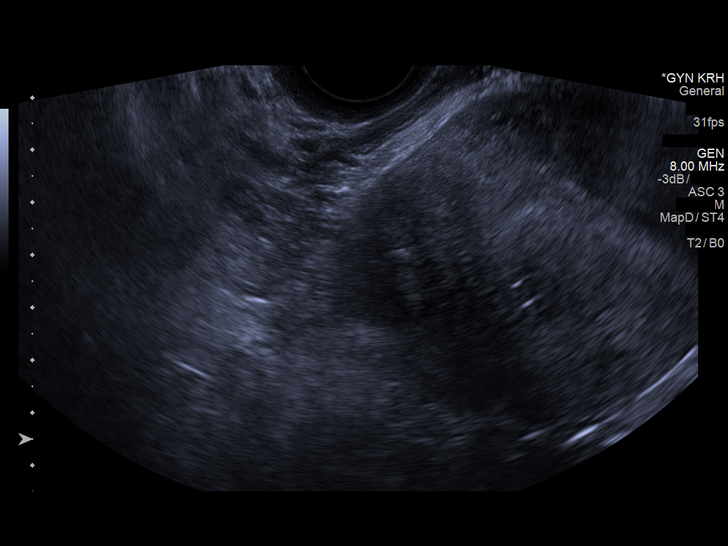
[im 57/57]
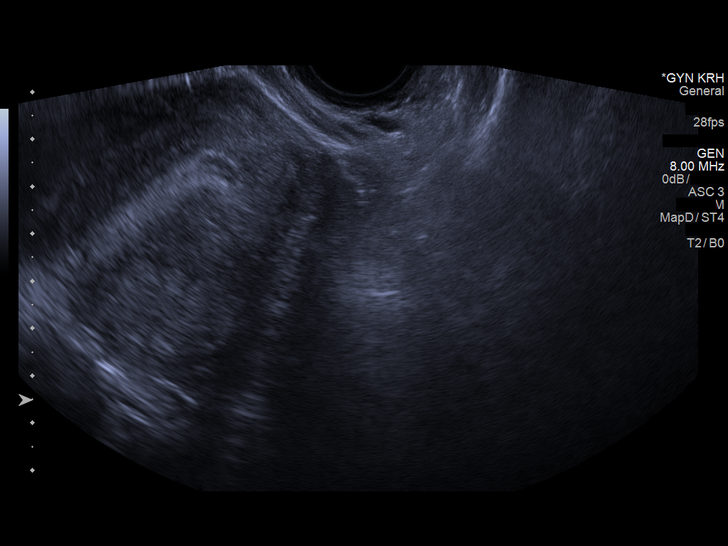

[14 of 25 positions shown; findings below may reference images not displayed]

FINDINGS: Uterus

Measurements: 12.4 x 6.1 x 6.8 cm. Suspected 2.6 x 2.5 x 2.1 cm
submucosal fibroid in the posterior uterine body. Additional 1.0 x
0.6 x 0.6 cm subserosal fibroid in the right anterior uterine body.

Endometrium

Thickness: 5 mm.  No focal abnormality visualized.

Right ovary

Measurements: 3.7 x 2.2 x 3.2 cm. Normal appearance/no adnexal mass.

Left ovary

Measurements: 4.3 x 2.1 x 3.1 cm. Dominant 2.2 cm cyst/follicle,
physiologic.

Other findings

No abnormal free fluid.
IMPRESSION: Two uterine fibroids, including a dominant 2.6 cm submucosal fibroid
in the posterior uterine body.

Endometrial complex measures 5 mm, within normal limits.

## 2019-03-07 ENCOUNTER — Ambulatory Visit (HOSPITAL_COMMUNITY)
Admission: EM | Admit: 2019-03-07 | Discharge: 2019-03-07 | Disposition: A | Discharge status: Home / Self Care | Payer: BLUE CROSS/BLUE SHIELD | Attending: Family Medicine | Admitting: Family Medicine

## 2019-03-07 ENCOUNTER — Telehealth: Payer: Self-pay

## 2019-03-07 ENCOUNTER — Encounter (HOSPITAL_COMMUNITY): Payer: Self-pay | Admitting: Emergency Medicine

## 2019-03-07 ENCOUNTER — Telehealth: Payer: BLUE CROSS/BLUE SHIELD | Admitting: Family

## 2019-03-07 ENCOUNTER — Other Ambulatory Visit: Payer: Self-pay

## 2019-03-07 DIAGNOSIS — R05 Cough: Secondary | ICD-10-CM

## 2019-03-07 DIAGNOSIS — R059 Cough, unspecified: Secondary | ICD-10-CM

## 2019-03-07 DIAGNOSIS — B349 Viral infection, unspecified: Secondary | ICD-10-CM | POA: Diagnosis not present

## 2019-03-07 DIAGNOSIS — R0602 Shortness of breath: Secondary | ICD-10-CM

## 2019-03-07 DIAGNOSIS — R062 Wheezing: Secondary | ICD-10-CM

## 2019-03-07 DIAGNOSIS — R0789 Other chest pain: Secondary | ICD-10-CM

## 2019-03-07 MED ORDER — BENZONATATE 100 MG PO CAPS: 100.0000 mg | ORAL_CAPSULE | Freq: Three times a day (TID) | ORAL | 0 refills | Status: AC

## 2019-03-07 MED ORDER — IPRATROPIUM BROMIDE 0.06 % NA SOLN: 2.0000 | Freq: Four times a day (QID) | NASAL | 0 refills | Status: AC

## 2019-03-07 MED ORDER — FLUTICASONE PROPIONATE 50 MCG/ACT NA SUSP: 2.0000 | Freq: Every day | NASAL | 0 refills | Status: AC

## 2019-03-07 MED ORDER — PREDNISONE 50 MG PO TABS: 50.0000 mg | ORAL_TABLET | Freq: Every day | ORAL | 0 refills | Status: AC

## 2019-03-07 NOTE — ED Provider Notes (Signed)
[place]    CSN: [id number] Arrival date & time: 03/07/19  1253     History   Chief Complaint No chief complaint on file.   HPI Kristina Griffin is a 43 y.o. female.   43 year old female comes in for 2 day history of URI symptoms. Patient states went through e visit and was not qualified for COVID testing. She has had dry cough, neck pain, chest soreness with cough. Has had generalized mild headache. Denies nausea, vomiting. Denies rhinorrhea, nasal congestion. Mild sore throat from cough. Denies fever. No shortness of breath, but has felt wheezing in the chest. Has had some hot chills. Never smoker. Possible sick contact. Has not taken any medicine for the symptoms.      Past Medical History:  Diagnosis Date  . Bipolar disorder ([place])   . Depression     There are no active problems to display for this patient.   Past Surgical History:  Procedure Laterality Date  . CESAREAN SECTION    . TUBAL LIGATION      OB History    Gravida  4   Para  3   Term  3   Preterm      AB  1   Living  2     SAB  1   TAB      Ectopic      Multiple      Live Births               Home Medications    Prior to Admission medications   Medication Sig Start Date End Date Taking? Authorizing Provider  benzonatate (TESSALON) 100 MG capsule Take 1 capsule (100 mg total) by mouth every 8 (eight) hours. 03/07/19   Tasia Catchings,  V, PA-C  buPROPion (WELLBUTRIN) 100 MG tablet Take 100 mg by mouth 2 (two) times daily. Reported on 06/10/2016    [provider]  Fe Fum-FePoly-Vit C-Vit B3 (INTEGRA) 62.5-62.5-40-3 MG CAPS Take 1 capsule by mouth daily. 06/24/16   Lavonia Drafts, MD  fluticasone (FLONASE) 50 MCG/ACT nasal spray Place 2 sprays into both nostrils daily. 03/07/19   Tasia Catchings,  V, PA-C  ipratropium (ATROVENT) 0.06 % nasal spray Place 2 sprays into both nostrils 4 (four) times daily. 03/07/19   Tasia Catchings,  V, PA-C  lamoTRIgine (LAMICTAL) 100 MG tablet Take 100 mg  by mouth daily.    [provider]  multivitamin-lutein (OCUVITE-LUTEIN) CAPS capsule Take 1 capsule by mouth daily.    [provider]  potassium chloride SA (K-DUR,KLOR-CON) 20 MEQ tablet Take 1 tablet (20 mEq total) by mouth 2 (two) times daily. 06/04/12 06/04/13  Leonard Schwartz, MD  predniSONE (DELTASONE) 50 MG tablet Take 1 tablet (50 mg total) by mouth daily. 03/07/19   Tasia Catchings,  V, PA-C  VYVANSE 50 MG capsule TK ONE C PO  Q MORNING 05/03/16   [provider]    Family History Family History  Problem Relation Age of Onset  . Drug abuse Maternal Grandmother   . Hypertension Maternal Grandfather   . Cancer Neg Hx   . Stroke Neg Hx     Social History Social History   Tobacco Use  . Smoking status: Never Smoker  Substance Use Topics  . Alcohol use: No  . Drug use: No     Allergies   Latex and Shellfish-derived products   Review of Systems Review of Systems  Reason unable to perform ROS: See HPI as above.  Physical Exam Triage Vital Signs ED Triage Vitals [03/07/19 1322]  Enc Vitals Group     BP (!) 110/99     Pulse Rate 65     Resp 18     Temp 98.3 F (36.8 C)     Temp src      SpO2 97 %     Weight      Height      Head Circumference      Peak Flow      Pain Score      Pain Loc      Pain Edu?      Excl. in [place]?    No data found.  Updated Vital Signs BP (!) 110/99   Pulse 65   Temp 98.3 F (36.8 C)   Resp 18   SpO2 97%   Physical Exam Constitutional:      General: She is not in acute distress.    Appearance: She is well-developed. She is not ill-appearing, toxic-appearing or diaphoretic.  HENT:     Head: Normocephalic and atraumatic.     Right Ear: Tympanic membrane, ear canal and external ear normal. Tympanic membrane is not erythematous or bulging.     Left Ear: Tympanic membrane, ear canal and external ear normal. Tympanic membrane is not erythematous or bulging.     Nose:     Right Turbinates: Swollen.     Left  Turbinates: Swollen.     Right Sinus: Maxillary sinus tenderness present. No frontal sinus tenderness.     Left Sinus: Maxillary sinus tenderness present. No frontal sinus tenderness.     Mouth/Throat:     Mouth: Mucous membranes are moist.     Pharynx: Oropharynx is clear. Uvula midline.  Eyes:     Conjunctiva/sclera: Conjunctivae normal.     Pupils: Pupils are equal, round, and reactive to light.  Neck:     Musculoskeletal: Normal range of motion and neck supple.  Cardiovascular:     Rate and Rhythm: Normal rate and regular rhythm.     Heart sounds: Normal heart sounds. No murmur. No friction rub. No gallop.   Pulmonary:     Effort: Pulmonary effort is normal. No accessory muscle usage, prolonged expiration, respiratory distress or retractions.     Breath sounds: Normal breath sounds. No stridor, decreased air movement or transmitted upper airway sounds. No decreased breath sounds, wheezing, rhonchi or rales.  Skin:    General: Skin is warm and dry.  Neurological:     Mental Status: She is alert and oriented to person, place, and time.      UC Treatments / Results  Labs (all labs ordered are listed, but only abnormal results are displayed) Labs Reviewed - No data to display  EKG None  Radiology No results found.  Procedures Procedures (including critical care time)  Medications Ordered in UC Medications - No data to display  Initial Impression / Assessment and Plan / UC Course  I have reviewed the triage vital signs and the nursing notes.  Pertinent labs & imaging results that were available during my care of the patient were reviewed by me and considered in my medical decision making (see chart for details).    Discussed with patient low risk for COVID, given history and exam. Patient was frustrated and angry that COVID testing is not going to be provided. Patient afebrile without tachycardia, tachypnea. O2 sat 97%. Lungs CTAB. Reassurance provided. Will provide  symptomatic treatment. Will also provide prednisone for cough  if needed. Return precautions given.  Final Clinical Impressions(s) / UC Diagnoses   Final diagnoses:  Viral illness    ED Prescriptions    Medication Sig Dispense Auth. Provider   benzonatate (TESSALON) 100 MG capsule Take 1 capsule (100 mg total) by mouth every 8 (eight) hours. 21 capsule ,  V, PA-C   fluticasone (FLONASE) 50 MCG/ACT nasal spray Place 2 sprays into both nostrils daily. 1 g ,  V, PA-C   ipratropium (ATROVENT) 0.06 % nasal spray Place 2 sprays into both nostrils 4 (four) times daily. 15 mL ,  V, PA-C   predniSONE (DELTASONE) 50 MG tablet Take 1 tablet (50 mg total) by mouth daily. 5 tablet Tobin Chad, [place] 03/07/19 1354

## 2019-03-07 NOTE — Progress Notes (Signed)
Based on what you shared with me, I feel your condition warrants further evaluation and I recommend that you be seen for a face to face office visit.   Given your symptoms of cough, SOB, wheezing, and chest tightness I believe it would be best if you were evaluated face to face.    Approximately 5 minutes was spent documenting and reviewing patient's chart.   NOTE: If you entered your credit card information for this eVisit, you will not be charged. You may see a "hold" on your card for the $35 but that hold will drop off and you will not have a charge processed.  If you are having a true medical emergency please call 911.  If you need an urgent face to face visit, [place] has four urgent care centers for your convenience.  If you need care fast and have a high deductible or no insurance consider:   [email or web address] to reserve your spot online an avoid wait times  [facility] [address], [address] [place], [place] [address] 8 am to 8 pm Monday-Friday 10 am to 4 pm Saturday-Sunday *Across the street from [employer]  [place], [address] 8 am to 5 pm Monday-Friday * In the [facility] on the [facility]   The following sites will take your insurance:  . [facility] [place] a Provider at this Location  [address] [place], [place] [address] . 10 am to 8 pm Monday-Friday . 12 pm to 8 pm Saturday-Sunday   . [facility] at [place] a Provider at this Location  [address] [place], [place] [address] . 8 am to 8 pm Monday-Friday . 9 am to 6 pm Saturday . 11 am to 6 pm Sunday   . [facility] at [place] [employer] Directions  [address] [place], [place] [address] . 8 am to 8 pm Monday-Friday . 8 am to 4  pm Saturday-Sunday   Your e-visit answers were reviewed by a board certified advanced clinical practitioner to complete your personal care plan.  Thank you for using e-Visits.

## 2019-03-07 NOTE — Discharge Instructions (Signed)
Tessalon for cough. If continues with cough, can start prednisone as directed. Start flonase, atrovent nasal spray for nasal congestion/drainage. You can use over the counter nasal saline rinse such as neti pot for nasal congestion. Keep hydrated, your urine should be clear to pale yellow in color. Tylenol/motrin for fever and pain. Monitor for any worsening of symptoms, chest pain, shortness of breath, wheezing, swelling of the throat, follow up for reevaluation.   For sore throat/cough try using a honey-based tea. Use 3 teaspoons of honey with juice squeezed from half lemon. Place shaved pieces of ginger into 1/2-1 cup of water and warm over stove top. Then mix the ingredients and repeat every 4 hours as needed.

## 2019-03-07 NOTE — ED Triage Notes (Signed)
Pt did the e vist. Angry she was not a candidate for testing. Pt states "im just going to go out and infect everyone".

## 2019-04-14 NOTE — Telephone Encounter (Signed)
Phone call
